# Patient Record
Sex: Female | Born: 1975 | Race: Black or African American | Hispanic: No | Marital: Married
Health system: Southern US, Community
[De-identification: ages and names within clinical notes are randomized; demographics above are authoritative.]

---

## 1999-08-04 ENCOUNTER — Other Ambulatory Visit: Admission: RE | Admit: 1999-08-04 | Discharge: 1999-08-04 | Payer: Self-pay | Admitting: Obstetrics and Gynecology

## 2000-09-21 ENCOUNTER — Other Ambulatory Visit: Admission: RE | Admit: 2000-09-21 | Discharge: 2000-09-21 | Payer: Self-pay | Admitting: Obstetrics and Gynecology

## 2001-10-23 ENCOUNTER — Other Ambulatory Visit: Admission: RE | Admit: 2001-10-23 | Discharge: 2001-10-23 | Payer: Self-pay | Admitting: Obstetrics and Gynecology

## 2002-12-07 ENCOUNTER — Other Ambulatory Visit: Admission: RE | Admit: 2002-12-07 | Discharge: 2002-12-07 | Payer: Self-pay | Admitting: Obstetrics and Gynecology

## 2003-12-24 ENCOUNTER — Other Ambulatory Visit: Admission: RE | Admit: 2003-12-24 | Discharge: 2003-12-24 | Payer: Self-pay | Admitting: Obstetrics and Gynecology

## 2004-07-22 ENCOUNTER — Inpatient Hospital Stay (HOSPITAL_COMMUNITY): Admission: AD | Admit: 2004-07-22 | Discharge: 2004-07-24 | Payer: Self-pay | Admitting: Obstetrics and Gynecology

## 2004-07-27 ENCOUNTER — Inpatient Hospital Stay (HOSPITAL_COMMUNITY): Admission: AD | Admit: 2004-07-27 | Discharge: 2004-07-29 | Payer: Self-pay | Admitting: Obstetrics and Gynecology

## 2004-07-31 ENCOUNTER — Inpatient Hospital Stay (HOSPITAL_COMMUNITY): Admission: AD | Admit: 2004-07-31 | Discharge: 2004-07-31 | Payer: Self-pay | Admitting: Obstetrics and Gynecology

## 2004-09-09 ENCOUNTER — Other Ambulatory Visit: Admission: RE | Admit: 2004-09-09 | Discharge: 2004-09-09 | Payer: Self-pay | Admitting: Obstetrics and Gynecology

## 2004-10-05 ENCOUNTER — Emergency Department (HOSPITAL_COMMUNITY): Admission: EM | Admit: 2004-10-05 | Discharge: 2004-10-05 | Payer: Self-pay | Admitting: Emergency Medicine

## 2005-10-14 ENCOUNTER — Other Ambulatory Visit: Admission: RE | Admit: 2005-10-14 | Discharge: 2005-10-14 | Payer: Self-pay | Admitting: Obstetrics and Gynecology

## 2007-09-01 ENCOUNTER — Other Ambulatory Visit: Admission: RE | Admit: 2007-09-01 | Discharge: 2007-09-01 | Payer: Self-pay | Admitting: Obstetrics and Gynecology

## 2008-02-27 ENCOUNTER — Other Ambulatory Visit: Admission: RE | Admit: 2008-02-27 | Discharge: 2008-02-27 | Payer: Self-pay | Admitting: Obstetrics and Gynecology

## 2008-08-29 ENCOUNTER — Other Ambulatory Visit: Admission: RE | Admit: 2008-08-29 | Discharge: 2008-08-29 | Payer: Self-pay | Admitting: Obstetrics and Gynecology

## 2009-02-27 ENCOUNTER — Other Ambulatory Visit: Admission: RE | Admit: 2009-02-27 | Discharge: 2009-02-27 | Payer: Self-pay | Admitting: Obstetrics and Gynecology

## 2009-05-15 ENCOUNTER — Encounter: Admission: RE | Admit: 2009-05-15 | Discharge: 2009-05-15 | Payer: Self-pay | Admitting: Orthopedic Surgery

## 2009-09-02 ENCOUNTER — Other Ambulatory Visit: Admission: RE | Admit: 2009-09-02 | Discharge: 2009-09-02 | Payer: Self-pay | Admitting: Obstetrics and Gynecology

## 2010-03-12 ENCOUNTER — Other Ambulatory Visit: Admission: RE | Admit: 2010-03-12 | Discharge: 2010-03-12 | Payer: Self-pay | Admitting: Obstetrics and Gynecology

## 2010-12-18 NOTE — H&P (Signed)
[name], [name]                 ACCOUNT NO.:  [id number]   MEDICAL RECORD NO.:  [id number]           PATIENT TYPE:   LOCATION:                                 FACILITY:   PHYSICIAN:  Duke Salvia. Marcelle Overlie, M.D.    DATE OF BIRTH:   DATE OF ADMISSION:  07/27/2004  DATE OF DISCHARGE:                                HISTORY & PHYSICAL   CHIEF COMPLAINT:  Headache.   HISTORY OF PRESENT ILLNESS:  A 35 year old G67, P2 delivered December 21 a  female, Apgars 9 and 9. Her postpartum course was uneventful. Postpartum  hemoglobin 10.3. She was discharged on December 23. Was afebrile at that  time. Her BP postpartum on December 22 was 115/78, 130/74, 126/93. She  presented to the [facility] ED December 26 complaining of severe headache.  I was called when her blood pressure was noted to be 190/125 with brisk  reflexes and negative protein and was immediately transferred to MAU.  Evaluation here showed minimal elevation of her LFTs with a normal platelet  count. Protein negative with reflexes brisk. She was started immediately on  mag sulfate. Was given IV Apresoline, started on oral labetalol, and was  admitted to [facility] for magnesium sulfa and further monitoring.   ALLERGIES:  None.   OPERATION:  None.   OBSTETRICAL HISTORY:  SVD x2. She is now 5 days postpartum.   OTHER SURGERY:  She has had a cholecystectomy in 1994.   REVIEW OF SYSTEMS:  Significant for history of PIH with her first pregnancy  and also history of migraine headache. Blood type is O positive.   PHYSICAL EXAMINATION:  VITAL SIGNS:  BP on admission 190/120, afebrile.  LUNGS:  Clear.  NECK:  Supple.  BREASTS:  Not examined.  CARDIOVASCULAR:  Regular rate and rhythm without murmurs, rubs, or gallops.  ABDOMEN:  Soft, flat, normal bowel sounds, nontender.  PELVIC:  Exam was deferred. She had minimal vaginal bleeding. The fundus was  firm.  EXTREMITIES:  Revealed 1 to 2+ edema. Reflexes were 3 to 4+.   IMPRESSION:   Postpartum preeclampsia.   PLAN:  Will admit for control of blood pressure and IV magnesium sulfate  therapy.    Rich  RMH/MEDQ  D:  07/27/2004  T:  07/27/2004  Job:  [id number]

## 2012-09-19 ENCOUNTER — Ambulatory Visit
Admission: RE | Admit: 2012-09-19 | Discharge: 2012-09-19 | Disposition: A | Discharge status: Home / Self Care | Payer: BC Managed Care – PPO | Source: Ambulatory Visit | Attending: Family Medicine | Admitting: Family Medicine

## 2012-09-19 ENCOUNTER — Other Ambulatory Visit: Payer: Self-pay | Admitting: Family Medicine

## 2012-09-19 DIAGNOSIS — R05 Cough: Secondary | ICD-10-CM

## 2013-10-09 ENCOUNTER — Other Ambulatory Visit (HOSPITAL_COMMUNITY)
Admission: RE | Admit: 2013-10-09 | Discharge: 2013-10-09 | Disposition: A | Discharge status: Home / Self Care | Payer: BC Managed Care – PPO | Source: Ambulatory Visit | Attending: Nurse Practitioner | Admitting: Nurse Practitioner

## 2013-10-09 ENCOUNTER — Other Ambulatory Visit: Payer: Self-pay | Admitting: Nurse Practitioner

## 2013-10-09 DIAGNOSIS — Z1151 Encounter for screening for human papillomavirus (HPV): Secondary | ICD-10-CM | POA: Insufficient documentation

## 2013-10-09 DIAGNOSIS — Z01419 Encounter for gynecological examination (general) (routine) without abnormal findings: Secondary | ICD-10-CM | POA: Insufficient documentation

## 2014-09-19 ENCOUNTER — Other Ambulatory Visit: Payer: Self-pay

## 2014-09-19 DIAGNOSIS — Z1231 Encounter for screening mammogram for malignant neoplasm of breast: Secondary | ICD-10-CM

## 2014-09-27 ENCOUNTER — Ambulatory Visit
Admission: RE | Admit: 2014-09-27 | Discharge: 2014-09-27 | Disposition: A | Discharge status: Home / Self Care | Payer: BC Managed Care – PPO | Source: Ambulatory Visit

## 2014-09-27 DIAGNOSIS — Z1231 Encounter for screening mammogram for malignant neoplasm of breast: Secondary | ICD-10-CM

## 2014-09-30 ENCOUNTER — Other Ambulatory Visit: Payer: Self-pay | Admitting: Nurse Practitioner

## 2014-09-30 DIAGNOSIS — R928 Other abnormal and inconclusive findings on diagnostic imaging of breast: Secondary | ICD-10-CM

## 2014-10-04 ENCOUNTER — Ambulatory Visit
Admission: RE | Admit: 2014-10-04 | Discharge: 2014-10-04 | Disposition: A | Discharge status: Home / Self Care | Payer: BC Managed Care – PPO | Source: Ambulatory Visit | Attending: Nurse Practitioner | Admitting: Nurse Practitioner

## 2014-10-04 DIAGNOSIS — R928 Other abnormal and inconclusive findings on diagnostic imaging of breast: Secondary | ICD-10-CM

## 2015-04-22 ENCOUNTER — Other Ambulatory Visit: Payer: Self-pay | Admitting: Nurse Practitioner

## 2015-04-22 DIAGNOSIS — D241 Benign neoplasm of right breast: Secondary | ICD-10-CM

## 2015-05-01 ENCOUNTER — Ambulatory Visit
Admission: RE | Admit: 2015-05-01 | Discharge: 2015-05-01 | Disposition: A | Discharge status: Home / Self Care | Payer: BC Managed Care – PPO | Source: Ambulatory Visit | Attending: Nurse Practitioner | Admitting: Nurse Practitioner

## 2015-05-01 DIAGNOSIS — D241 Benign neoplasm of right breast: Secondary | ICD-10-CM

## 2015-10-27 ENCOUNTER — Other Ambulatory Visit: Payer: Self-pay | Admitting: Nurse Practitioner

## 2015-10-27 DIAGNOSIS — D241 Benign neoplasm of right breast: Secondary | ICD-10-CM

## 2015-11-03 ENCOUNTER — Ambulatory Visit
Admission: RE | Admit: 2015-11-03 | Discharge: 2015-11-03 | Disposition: A | Discharge status: Home / Self Care | Payer: BC Managed Care – PPO | Source: Ambulatory Visit | Attending: Nurse Practitioner | Admitting: Nurse Practitioner

## 2015-11-03 DIAGNOSIS — D241 Benign neoplasm of right breast: Secondary | ICD-10-CM

## 2016-12-07 ENCOUNTER — Other Ambulatory Visit: Payer: Self-pay | Admitting: Nurse Practitioner

## 2016-12-07 ENCOUNTER — Other Ambulatory Visit (HOSPITAL_COMMUNITY)
Admission: RE | Admit: 2016-12-07 | Discharge: 2016-12-07 | Disposition: A | Discharge status: Home / Self Care | Payer: BC Managed Care – PPO | Source: Ambulatory Visit | Attending: Nurse Practitioner | Admitting: Nurse Practitioner

## 2016-12-07 DIAGNOSIS — Z01419 Encounter for gynecological examination (general) (routine) without abnormal findings: Secondary | ICD-10-CM | POA: Insufficient documentation

## 2016-12-07 DIAGNOSIS — Z1151 Encounter for screening for human papillomavirus (HPV): Secondary | ICD-10-CM | POA: Insufficient documentation

## 2016-12-09 LAB — CYTOLOGY - PAP
Diagnosis: NEGATIVE
HPV (WINDOPATH): NOT DETECTED

## 2016-12-15 ENCOUNTER — Other Ambulatory Visit: Payer: Self-pay | Admitting: Nurse Practitioner

## 2016-12-15 DIAGNOSIS — N6001 Solitary cyst of right breast: Secondary | ICD-10-CM

## 2016-12-21 ENCOUNTER — Ambulatory Visit
Admission: RE | Admit: 2016-12-21 | Discharge: 2016-12-21 | Disposition: A | Discharge status: Home / Self Care | Payer: BC Managed Care – PPO | Source: Ambulatory Visit | Attending: Nurse Practitioner | Admitting: Nurse Practitioner

## 2016-12-21 DIAGNOSIS — N6001 Solitary cyst of right breast: Secondary | ICD-10-CM

## 2018-01-04 ENCOUNTER — Other Ambulatory Visit: Payer: Self-pay | Admitting: Nurse Practitioner

## 2018-01-04 DIAGNOSIS — Z1231 Encounter for screening mammogram for malignant neoplasm of breast: Secondary | ICD-10-CM

## 2018-01-27 ENCOUNTER — Ambulatory Visit: Payer: BC Managed Care – PPO

## 2018-02-03 ENCOUNTER — Ambulatory Visit
Admission: RE | Admit: 2018-02-03 | Discharge: 2018-02-03 | Disposition: A | Discharge status: Home / Self Care | Payer: BC Managed Care – PPO | Source: Ambulatory Visit | Attending: Nurse Practitioner | Admitting: Nurse Practitioner

## 2018-02-03 ENCOUNTER — Encounter: Payer: Self-pay | Admitting: Radiology

## 2018-02-03 DIAGNOSIS — Z1231 Encounter for screening mammogram for malignant neoplasm of breast: Secondary | ICD-10-CM

## 2019-01-31 ENCOUNTER — Other Ambulatory Visit: Payer: Self-pay | Admitting: Nurse Practitioner

## 2019-01-31 ENCOUNTER — Other Ambulatory Visit: Payer: Self-pay | Admitting: Rheumatology

## 2019-01-31 DIAGNOSIS — Z1231 Encounter for screening mammogram for malignant neoplasm of breast: Secondary | ICD-10-CM

## 2019-02-12 ENCOUNTER — Ambulatory Visit
Admission: RE | Admit: 2019-02-12 | Discharge: 2019-02-12 | Disposition: A | Discharge status: Home / Self Care | Payer: BC Managed Care – PPO | Source: Ambulatory Visit | Attending: Nurse Practitioner | Admitting: Nurse Practitioner

## 2019-02-12 ENCOUNTER — Other Ambulatory Visit: Payer: Self-pay

## 2019-02-12 DIAGNOSIS — Z1231 Encounter for screening mammogram for malignant neoplasm of breast: Secondary | ICD-10-CM

## 2020-03-19 ENCOUNTER — Other Ambulatory Visit: Payer: Self-pay | Admitting: Nurse Practitioner

## 2020-03-19 DIAGNOSIS — Z0000 Encounter for general adult medical examination without abnormal findings: Secondary | ICD-10-CM

## 2020-04-04 ENCOUNTER — Ambulatory Visit
Admission: RE | Admit: 2020-04-04 | Discharge: 2020-04-04 | Disposition: A | Discharge status: Home / Self Care | Payer: BC Managed Care – PPO | Source: Ambulatory Visit | Attending: Nurse Practitioner | Admitting: Nurse Practitioner

## 2020-04-04 ENCOUNTER — Other Ambulatory Visit: Payer: Self-pay

## 2020-04-04 DIAGNOSIS — Z0000 Encounter for general adult medical examination without abnormal findings: Secondary | ICD-10-CM

## 2021-03-26 ENCOUNTER — Other Ambulatory Visit: Payer: Self-pay | Admitting: Family Medicine

## 2021-03-26 DIAGNOSIS — Z1231 Encounter for screening mammogram for malignant neoplasm of breast: Secondary | ICD-10-CM

## 2021-04-22 ENCOUNTER — Ambulatory Visit
Admission: RE | Admit: 2021-04-22 | Discharge: 2021-04-22 | Disposition: A | Discharge status: Home / Self Care | Payer: BC Managed Care – PPO | Source: Ambulatory Visit | Attending: Family Medicine | Admitting: Family Medicine

## 2021-04-22 ENCOUNTER — Other Ambulatory Visit: Payer: Self-pay

## 2021-04-22 DIAGNOSIS — Z1231 Encounter for screening mammogram for malignant neoplasm of breast: Secondary | ICD-10-CM

## 2022-04-21 ENCOUNTER — Other Ambulatory Visit: Payer: Self-pay | Admitting: Family Medicine

## 2022-04-21 DIAGNOSIS — Z1231 Encounter for screening mammogram for malignant neoplasm of breast: Secondary | ICD-10-CM

## 2022-05-24 ENCOUNTER — Ambulatory Visit
Admission: RE | Admit: 2022-05-24 | Discharge: 2022-05-24 | Disposition: A | Discharge status: Home / Self Care | Payer: BC Managed Care – PPO | Source: Ambulatory Visit | Attending: Family Medicine | Admitting: Family Medicine

## 2022-05-24 DIAGNOSIS — Z1231 Encounter for screening mammogram for malignant neoplasm of breast: Secondary | ICD-10-CM

## 2022-09-18 ENCOUNTER — Ambulatory Visit: Payer: Self-pay

## 2022-10-12 ENCOUNTER — Other Ambulatory Visit: Payer: Self-pay | Admitting: Podiatry

## 2022-10-12 ENCOUNTER — Ambulatory Visit (INDEPENDENT_AMBULATORY_CARE_PROVIDER_SITE_OTHER): Payer: BC Managed Care – PPO

## 2022-10-12 ENCOUNTER — Ambulatory Visit: Payer: BC Managed Care – PPO | Admitting: Podiatry

## 2022-10-12 DIAGNOSIS — M79671 Pain in right foot: Secondary | ICD-10-CM

## 2022-10-12 DIAGNOSIS — S92352A Displaced fracture of fifth metatarsal bone, left foot, initial encounter for closed fracture: Secondary | ICD-10-CM | POA: Diagnosis not present

## 2022-10-12 DIAGNOSIS — S92354A Nondisplaced fracture of fifth metatarsal bone, right foot, initial encounter for closed fracture: Secondary | ICD-10-CM | POA: Diagnosis not present

## 2022-10-12 NOTE — Progress Notes (Signed)
    Subjective:  Patient ID: [name], female    DOB: 1976-02-11,  MRN: [id number]  No chief complaint on file.   47 y.o. female presents with the above complaint.  Patient presents with right fifth metatarsal fracture.  She states that she was seen at [facility].  Original date of injury was September 18, 2022.  She wanted to get a second opinion to make sure that everything is healing okay.  Denies any other acute complaints she is in a cam boot.  She still gets some discomfort.  She denies any other acute issues.  Pain scale is 5 out of 10 dull achy in nature   Review of Systems: Negative except as noted in the HPI. Denies N/V/F/Ch.  No past medical history on file. No current outpatient medications on file.  Social History   Tobacco Use  Smoking Status Not on file  Smokeless Tobacco Not on file    No Known Allergies Objective:  There were no vitals filed for this visit. There is no height or weight on file to calculate BMI. Constitutional Well developed. Well nourished.  Vascular Dorsalis pedis pulses palpable bilaterally. Posterior tibial pulses palpable bilaterally. Capillary refill normal to all digits.  No cyanosis or clubbing noted. Pedal hair growth normal.  Neurologic Normal speech. Oriented to person, place, and time. Epicritic sensation to light touch grossly present bilaterally.  Dermatologic Nails well groomed and normal in appearance. No open wounds. No skin lesions.  Orthopedic: Pain on palpation to the right fifth metatarsal bone.  Paylean along the course of the fifth metatarsal no pain with range of motion of the fifth digit.  No pain at the fifth metatarsal base   Radiographs: 3 views of skeletally mature the right foot: Nondisplaced fifth metatarsal fracture noted with slight shortening.  Well aligned no severe angulation Assessment:   1. Closed nondisplaced fracture of fifth metatarsal bone of right foot, initial encounter    Plan:  Patient was  evaluated and treated and all questions answered.  Right fifth metatarsal fracture closed nondisplaced -All questions and concerns were discussed with the patient in extensive detail. -I discussed with the patient she will need to wear the boot for another 4 weeks and then transition to regular shoes.  Ultimately I discussed with her that this fracture can take up to a year to completely heal and also 5.  She states understanding. -She has a cam boot at home and has placed herself in it for next 4 weeks  No follow-ups on file.   Fifth metatarsal fracture.  She has a boot she will wear the boot for next 4 weeks and then transition to regular shoes.  Original date of injury was mid February

## 2022-10-13 ENCOUNTER — Other Ambulatory Visit: Payer: Self-pay | Admitting: Podiatry

## 2022-10-13 DIAGNOSIS — M79671 Pain in right foot: Secondary | ICD-10-CM

## 2022-10-13 DIAGNOSIS — S92352A Displaced fracture of fifth metatarsal bone, left foot, initial encounter for closed fracture: Secondary | ICD-10-CM

## 2022-11-26 ENCOUNTER — Ambulatory Visit: Payer: BC Managed Care – PPO | Admitting: Podiatry

## 2022-11-30 ENCOUNTER — Ambulatory Visit: Payer: BC Managed Care – PPO | Admitting: Podiatry

## 2022-11-30 ENCOUNTER — Ambulatory Visit (INDEPENDENT_AMBULATORY_CARE_PROVIDER_SITE_OTHER): Payer: BC Managed Care – PPO

## 2022-11-30 DIAGNOSIS — S92354A Nondisplaced fracture of fifth metatarsal bone, right foot, initial encounter for closed fracture: Secondary | ICD-10-CM | POA: Diagnosis not present

## 2022-11-30 NOTE — Progress Notes (Signed)
    Subjective:  Patient ID: [name], female    DOB: 1975/11/21,  MRN: [id number]  No chief complaint on file.   48 y.o. female presents with the above complaint.  Patient presents for follow-up of fifth metatarsal fracture.  She states she is doing a lot better she has occasional pain but has improved considerably in the boot.   Review of Systems: Negative except as noted in the HPI. Denies N/V/F/Ch.  No past medical history on file. No current outpatient medications on file.  Social History   Tobacco Use  Smoking Status Not on file  Smokeless Tobacco Not on file    No Known Allergies Objective:  There were no vitals filed for this visit. There is no height or weight on file to calculate BMI. Constitutional Well developed. Well nourished.  Vascular Dorsalis pedis pulses palpable bilaterally. Posterior tibial pulses palpable bilaterally. Capillary refill normal to all digits.  No cyanosis or clubbing noted. Pedal hair growth normal.  Neurologic Normal speech. Oriented to person, place, and time. Epicritic sensation to light touch grossly present bilaterally.  Dermatologic Nails well groomed and normal in appearance. No open wounds. No skin lesions.  Orthopedic: No further pain on palpation to the right fifth metatarsal bone.  No further Paylean along the course of the fifth metatarsal no pain with range of motion of the fifth digit.  No pain at the fifth metatarsal base   Radiographs: 3 views of skeletally mature the right foot: Nondisplaced fifth metatarsal fracture noted with slight shortening.  Well aligned no severe angulation Assessment:   1. Closed nondisplaced fracture of fifth metatarsal bone of right foot, initial encounter    Plan:  Patient was evaluated and treated and all questions answered.  Right fifth metatarsal fracture closed nondisplaced -All questions and concerns were discussed with the patient in extensive detail. -Pain clinically improved  considerably.  I discussed with the patient she can return to regular activities regular shoes if any foot and ankle as well as future she will come back and see me.

## 2023-02-05 IMAGING — MG MM DIGITAL SCREENING BILAT W/ TOMO AND CAD
8 series · 8 of 24 positions shown · non-contrast
Comparison: Previous exam(s).

ACR Breast Density Category a: The breast tissue is almost entirely
fatty.

CLINICAL DATA: Screening.

EXAM:
DIGITAL SCREENING BILATERAL MAMMOGRAM WITH TOMOSYNTHESIS AND CAD
TECHNIQUE: Bilateral screening digital craniocaudal and mediolateral oblique
mammograms were obtained. Bilateral screening digital breast
tomosynthesis was performed. The images were evaluated with
computer-aided detection.

[R MLO synth-2D]
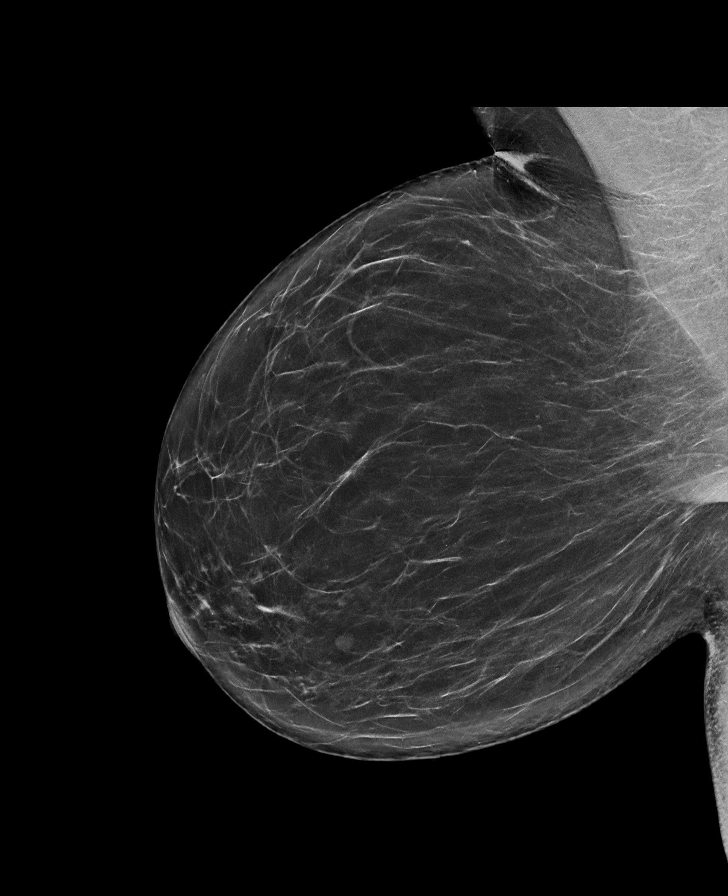

[L MLO synth-2D]
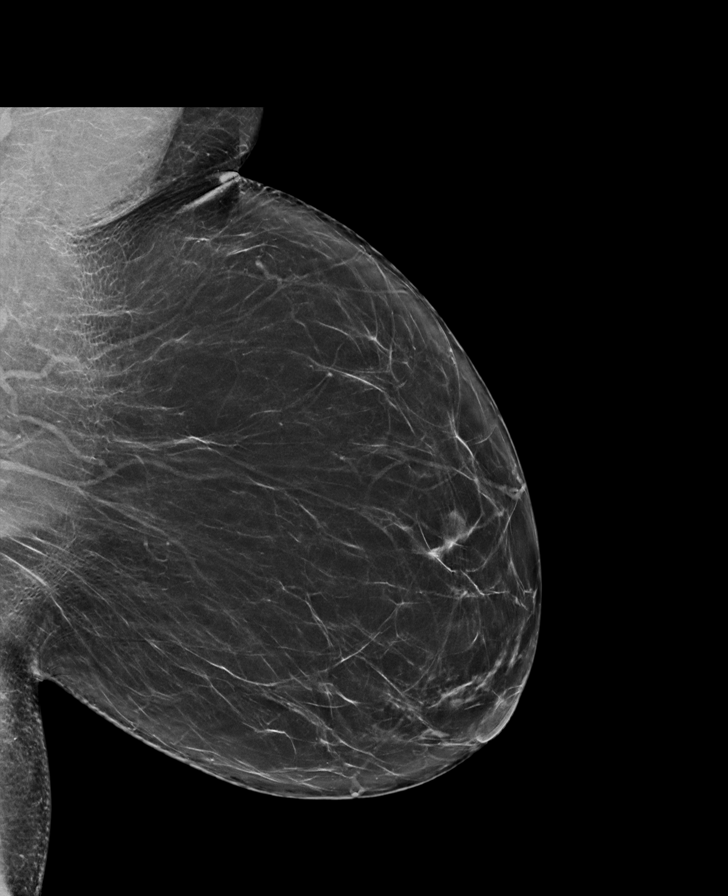

[R CC synth-2D]
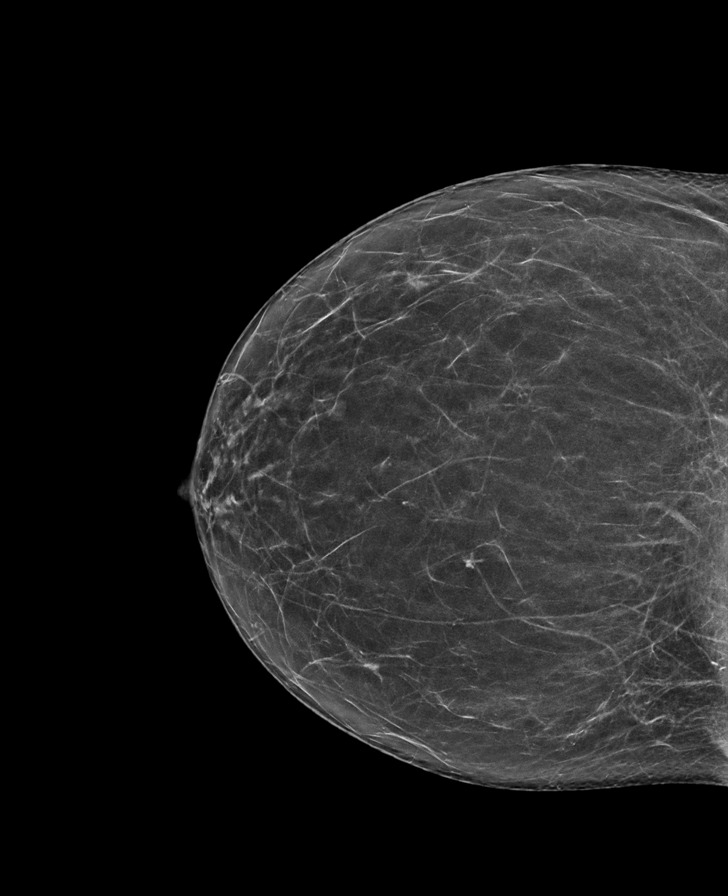

[L CC synth-2D]
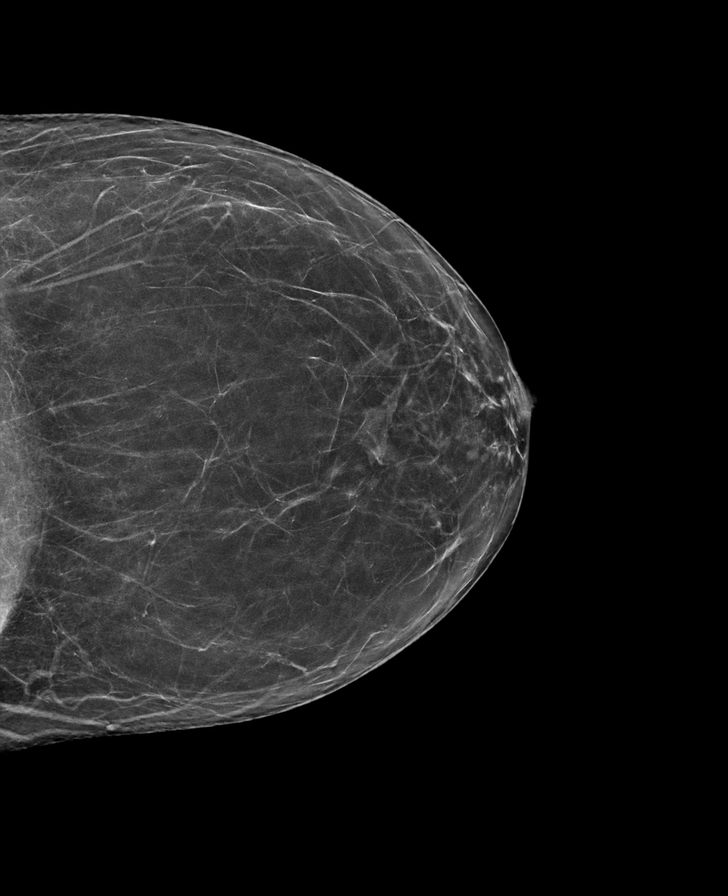

[L CC tomo · tomo slice 33/65.0]
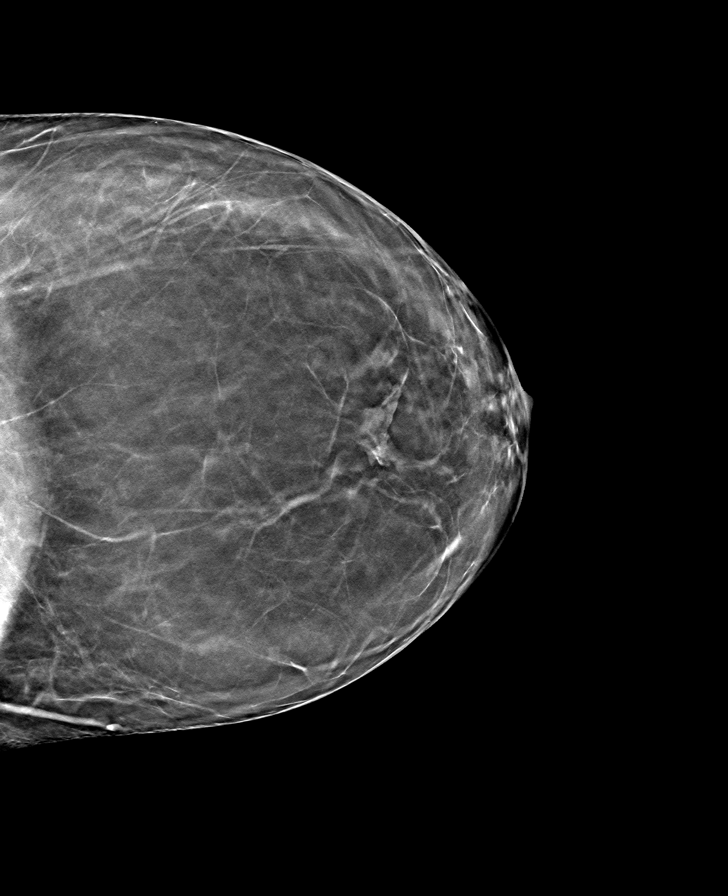

[R MLO tomo · tomo slice 39/76.0]
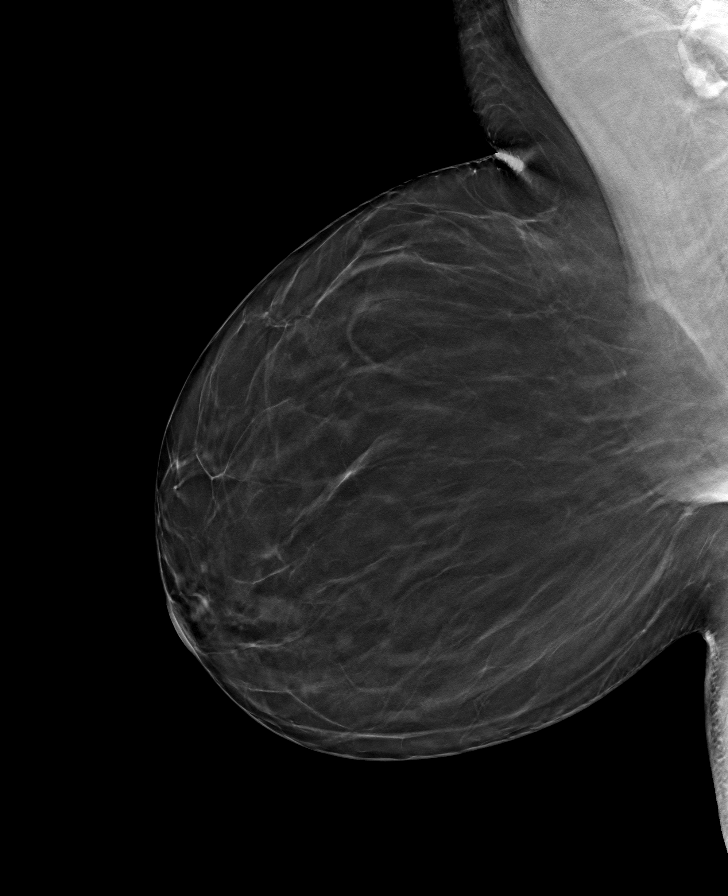

[R CC tomo · tomo slice 33/65.0]
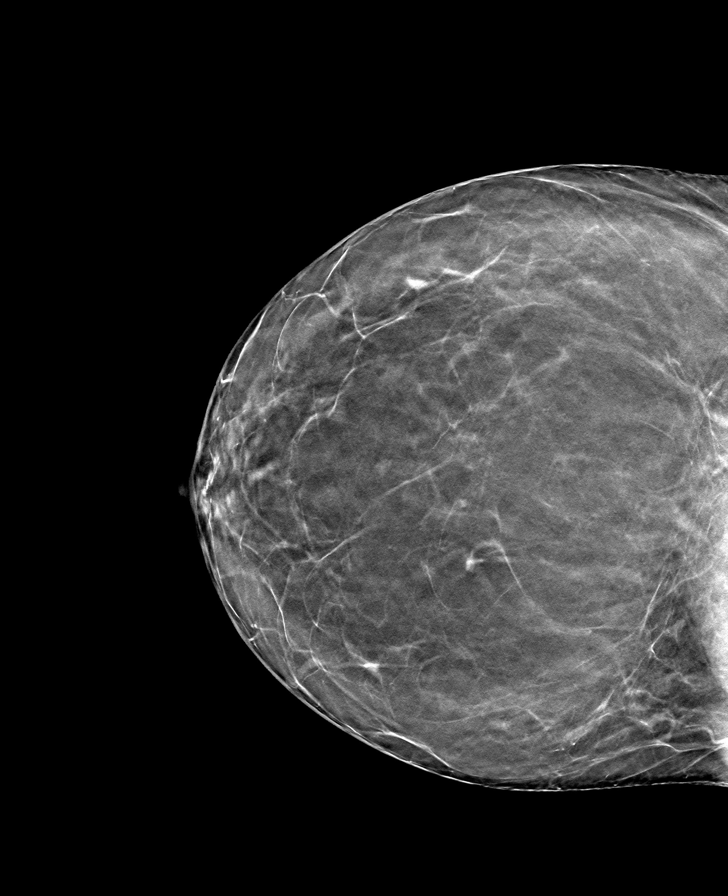

[L MLO tomo · tomo slice 40/79.0]
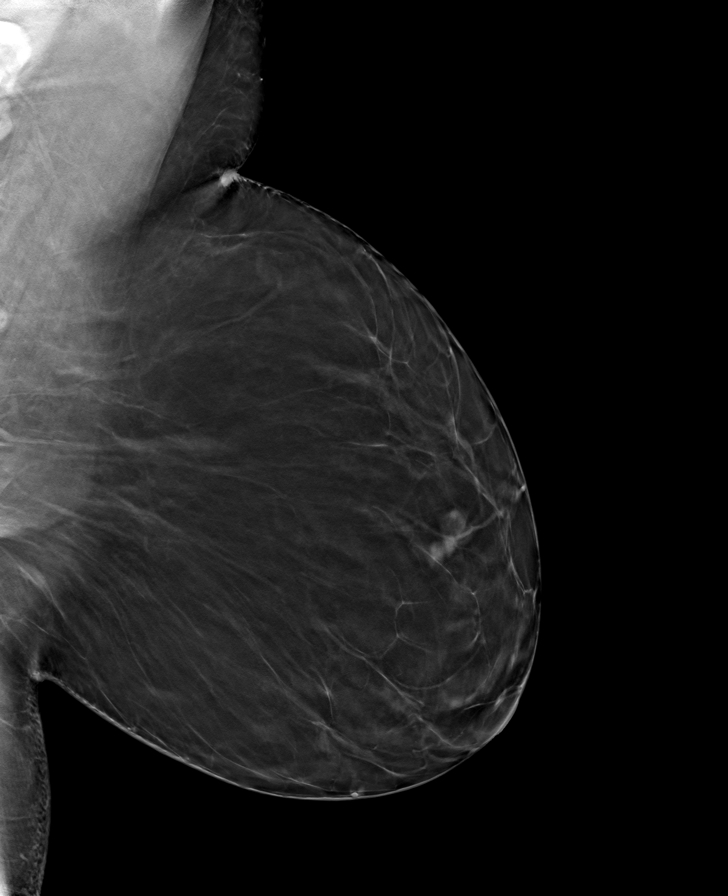

[8 of 24 positions shown; findings below may reference images not displayed]

FINDINGS: There are no findings suspicious for malignancy.
IMPRESSION: No mammographic evidence of malignancy. A result letter of this
screening mammogram will be mailed directly to the patient.

RECOMMENDATION:
Screening mammogram in one year. (Code:0E-3-N98)

BI-RADS CATEGORY  1: Negative.

## 2023-06-02 ENCOUNTER — Other Ambulatory Visit: Payer: Self-pay | Admitting: Family Medicine

## 2023-06-02 DIAGNOSIS — Z1231 Encounter for screening mammogram for malignant neoplasm of breast: Secondary | ICD-10-CM

## 2023-06-22 ENCOUNTER — Ambulatory Visit
Admission: RE | Admit: 2023-06-22 | Discharge: 2023-06-22 | Disposition: A | Discharge status: Home / Self Care | Payer: BC Managed Care – PPO | Source: Ambulatory Visit

## 2023-06-22 DIAGNOSIS — Z1231 Encounter for screening mammogram for malignant neoplasm of breast: Secondary | ICD-10-CM

## 2023-08-04 ENCOUNTER — Other Ambulatory Visit (HOSPITAL_COMMUNITY)
Admission: RE | Admit: 2023-08-04 | Discharge: 2023-08-04 | Disposition: A | Discharge status: Home / Self Care | Payer: 59 | Source: Ambulatory Visit | Attending: Obstetrics and Gynecology | Admitting: Obstetrics and Gynecology

## 2023-08-04 DIAGNOSIS — Z01419 Encounter for gynecological examination (general) (routine) without abnormal findings: Secondary | ICD-10-CM | POA: Diagnosis present

## 2023-08-09 LAB — CYTOLOGY - PAP
Comment: NEGATIVE
Diagnosis: NEGATIVE
High risk HPV: NEGATIVE

## 2024-05-08 ENCOUNTER — Other Ambulatory Visit: Payer: Self-pay | Admitting: Family Medicine

## 2024-05-08 DIAGNOSIS — Z1231 Encounter for screening mammogram for malignant neoplasm of breast: Secondary | ICD-10-CM

## 2024-06-22 ENCOUNTER — Ambulatory Visit
Admission: RE | Admit: 2024-06-22 | Discharge: 2024-06-22 | Disposition: A | Discharge status: Home / Self Care | Source: Ambulatory Visit | Attending: Family Medicine | Admitting: Family Medicine

## 2024-06-22 DIAGNOSIS — Z1231 Encounter for screening mammogram for malignant neoplasm of breast: Secondary | ICD-10-CM
